# Patient Record
Sex: Male | Born: 1995 | Race: Black or African American | Hispanic: No | Marital: Married | Smoking: Never smoker
Health system: Southern US, Community
[De-identification: ages and names within clinical notes are randomized; demographics above are authoritative.]

---
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
## 2019-01-28 ENCOUNTER — Other Ambulatory Visit: Payer: Self-pay

## 2019-01-28 DIAGNOSIS — Z20822 Contact with and (suspected) exposure to covid-19: Secondary | ICD-10-CM

## 2019-01-29 LAB — NOVEL CORONAVIRUS, NAA: SARS-CoV-2, NAA: NOT DETECTED

## 2019-03-14 ENCOUNTER — Other Ambulatory Visit: Payer: Self-pay

## 2019-03-14 DIAGNOSIS — Z20822 Contact with and (suspected) exposure to covid-19: Secondary | ICD-10-CM

## 2019-03-17 LAB — NOVEL CORONAVIRUS, NAA: SARS-CoV-2, NAA: NOT DETECTED

## 2021-05-02 ENCOUNTER — Ambulatory Visit
Admission: EM | Admit: 2021-05-02 | Discharge: 2021-05-02 | Disposition: A | Discharge status: Home / Self Care | Payer: Self-pay

## 2021-05-02 ENCOUNTER — Other Ambulatory Visit: Payer: Self-pay

## 2021-05-02 DIAGNOSIS — A64 Unspecified sexually transmitted disease: Secondary | ICD-10-CM

## 2021-05-02 NOTE — ED Triage Notes (Signed)
Pt was tested for [facility] Department and was given a STD Test for everything but Herpes. Pt states that it was negative and he asks if a doctor can come in and test him for Herpes or if he can be evaluated for any sores for Herpes.   Pt was prescribed acyclovir in 2016.

## 2021-05-02 NOTE — ED Provider Notes (Signed)
[facility]    CSN: [id number] Arrival date & time: 05/02/21  1618      History   Chief Complaint Chief Complaint  Patient presents with   Exposure to STD    HPI Guy Vincent is a 26 y.o. male who wants to have a genital exam to make sure he does not have any STD lesions. He gets STD tests yearly, but has not been examined. Denies having any symptoms, and currently is in a monogamous relationship with a partner who had negative STD's.     History reviewed. No pertinent past medical history.  There are no problems to display for this patient.   History reviewed. No pertinent surgical history.     Home Medications    Prior to Admission medications   Medication Sig Start Date End Date Taking? Authorizing Provider  acyclovir (ZOVIRAX) 400 MG tablet Take 1 tablet (400 mg total) by mouth 4 (four) times daily. 10/31/14   Fayrene Helper, PA-C    Family History History reviewed. No pertinent family history.  Social History Social History   Tobacco Use   Smoking status: Never  Vaping Use   Vaping Use: Never used  Substance Use Topics   Alcohol use: No   Drug use: No     Allergies   Patient has no known allergies.   Review of Systems Review of Systems  All other systems reviewed and are negative.   Physical Exam Triage Vital Signs ED Triage Vitals  Enc Vitals Group     BP 05/02/21 1640 124/84     Pulse Rate 05/02/21 1640 80     Resp 05/02/21 1640 18     Temp 05/02/21 1640 98.7 F (37.1 C)     Temp Source 05/02/21 1640 Oral     SpO2 05/02/21 1640 99 %     Weight 05/02/21 1635 170 lb (77.1 kg)     Height 05/02/21 1635 5\' 9"  (1.753 m)     Head Circumference --      Peak Flow --      Pain Score 05/02/21 1635 0     Pain Loc --      Pain Edu? --      Excl. in GC? --    No data found.  Updated Vital Signs BP 124/84 (BP Location: Left Arm)    Pulse 80    Temp 98.7 F (37.1 C) (Oral)    Resp 18    Ht 5\' 9"  (1.753 m)    Wt 170 lb (77.1 kg)     SpO2 99%    BMI 25.10 kg/m   Visual Acuity Right Eye Distance:   Left Eye Distance:   Bilateral Distance:    Right Eye Near:   Left Eye Near:    Bilateral Near:     Physical Exam Vitals and nursing note reviewed.  Constitutional:      General: He is not in acute distress.    Appearance: He is not toxic-appearing.  Eyes:     General: No scleral icterus.    Conjunctiva/sclera: Conjunctivae normal.  Pulmonary:     Effort: Pulmonary effort is normal.  Genitourinary:    Penis: Normal.      Testes: Normal.  Musculoskeletal:        General: Normal range of motion.  Skin:    General: Skin is warm and dry.     Findings: No lesion or rash.  Neurological:     Mental Status: He is alert and  oriented to person, place, and time.     Gait: Gait normal.  Psychiatric:        Mood and Affect: Mood normal.        Behavior: Behavior normal.        Thought Content: Thought content normal.        Judgment: Judgment normal.     UC Treatments / Results  Labs (all labs ordered are listed, but only abnormal results are displayed) Labs Reviewed - No data to display  EKG   Radiology No results found.  Procedures Procedures (including critical care time)  Medications Ordered in UC Medications - No data to display  Initial Impression / Assessment and Plan / UC Course  I have reviewed the triage vital signs and the nursing notes. No disease found Pt reassured his genital exam is normal. He will FU with his PCP as usual.  Final Clinical Impressions(s) / UC Diagnoses   Final diagnoses:  STD (male)   Discharge Instructions   None    ED Prescriptions   None    PDMP not reviewed this encounter.   Garey Ham, PA-C 05/02/21 1703

## 2021-06-21 ENCOUNTER — Ambulatory Visit
Admission: EM | Admit: 2021-06-21 | Discharge: 2021-06-21 | Disposition: A | Discharge status: Home / Self Care | Payer: Self-pay

## 2021-06-21 ENCOUNTER — Other Ambulatory Visit: Payer: Self-pay

## 2021-06-21 DIAGNOSIS — L853 Xerosis cutis: Secondary | ICD-10-CM

## 2021-06-21 DIAGNOSIS — K117 Disturbances of salivary secretion: Secondary | ICD-10-CM

## 2021-06-21 DIAGNOSIS — T50905A Adverse effect of unspecified drugs, medicaments and biological substances, initial encounter: Secondary | ICD-10-CM

## 2021-06-21 NOTE — Medical Student Note (Incomplete)
? ?[facility] ?Provider Student Note ?For educational purposes for Medical, PA and NP students only and not part of the legal medical record. ? ? ?CSN: [id number] ?Arrival date & time: 06/21/21  1138 ? ? ?  ? ?History   ?Chief Complaint ?Chief Complaint  ?Patient presents with  ? Lip problem  ? ? ?HPI ?Guy Vincent is a 26 y.o. male. ? ?Guy Vincent is a 26 yo male presenting to the clinic for lip dryness. He said he started isotretinoin 1 month ago for acne and his lips have been dry since then. He has tried lip balm but he says they are not working. He denies lesions, sores, bleeding on his lips or in his mouth. ? ?The history is provided by the patient.  ? ?History reviewed. No pertinent past medical history. ? ?There are no problems to display for this patient. ? ? ?History reviewed. No pertinent surgical history. ? ? ? ? ?Home Medications   ? ?Prior to Admission medications   ?Medication Sig Start Date End Date Taking? Authorizing Provider  ?acyclovir (ZOVIRAX) 400 MG tablet Take 1 tablet (400 mg total) by mouth 4 (four) times daily. 10/31/14   Fayrene Helper, PA-C  ?ISOtretinoin (ACCUTANE) 30 MG capsule Take by mouth daily. 05/23/21   [provider]  ? ? ?Family History ?History reviewed. No pertinent family history. ? ?Social History ?Social History  ? ?Tobacco Use  ? Smoking status: Never  ?Vaping Use  ? Vaping Use: Never used  ?Substance Use Topics  ? Alcohol use: No  ? Drug use: Never  ? ? ? ?Allergies   ?Patient has no known allergies. ? ? ?Review of Systems ?Review of Systems  ?HENT:  Negative for mouth sores.   ?Skin:  Negative for color change, pallor, rash and wound.  ?     Dryness on lips  ? ? ?Physical Exam ?Updated Vital Signs ?BP 124/85 (BP Location: Right Arm)   Pulse 79   Temp 99.6 ?F (37.6 ?C) (Oral)   Resp 16   SpO2 97%  ? ?Physical Exam ?Constitutional:   ?   Appearance: Normal appearance.  ?HENT:  ?   Head: Normocephalic and atraumatic.  ?   Nose: Nose normal.  ?Eyes:  ?    Pupils: Pupils are equal, round, and reactive to light.  ?Cardiovascular:  ?   Rate and Rhythm: Normal rate and regular rhythm.  ?Pulmonary:  ?   Effort: Pulmonary effort is normal.  ?Musculoskeletal:  ?   Cervical back: Normal range of motion.  ?Skin: ?   General: Skin is warm and dry.  ?   Findings: No erythema, lesion or rash.  ?   Comments: Mild dryness of upper and lower lip  ?Neurological:  ?   Mental Status: He is alert.  ?Psychiatric:     ?   Mood and Affect: Mood normal.     ?   Behavior: Behavior normal.     ?   Thought Content: Thought content normal.     ?   Judgment: Judgment normal.  ? ? ? ?ED Treatments / Results  ?Labs ?(all labs ordered are listed, but only abnormal results are displayed) ?Labs Reviewed - No data to display ? ?EKG ? ?Radiology ?No results found. ? ?Procedures ?Procedures (including critical care time) ? ?Medications Ordered in ED ?Medications - No data to display ? ? ?Initial Impression / Assessment and Plan / ED Course  ?I have reviewed the triage vital signs and the  nursing notes. ? ?Pertinent labs & imaging results that were available during my care of the patient were reviewed by me and considered in my medical decision making (see chart for details). ? ?Patient was advised to take other emollients other than carmex for dry lips such as aquaphor or vaseline. Patient should avoid licking lips and should drink more water to improve hydration. ? ?  ? ?*** ? ?Final Clinical Impressions(s) / ED Diagnoses  ? ?Final diagnoses:  ?None  ? ? ?New Prescriptions ?New Prescriptions  ? No medications on file  ? ? ?

## 2021-06-21 NOTE — ED Provider Notes (Signed)
?[facility] ? ? ? ?CSN: [id number] ?Arrival date & time: 06/21/21  1138 ? ? ?  ? ?History   ?Chief Complaint ?Chief Complaint  ?Patient presents with  ? Lip problem  ? ? ?HPI ?Guy Vincent is a 26 y.o. male.  ? ?HPI ? ?26 year old male here for evaluation of integumentary complaints. ? ?Patient reports that he has been on isotretinoin for the last month and since starting that he has been noticing dry lips.  He has used Forensic psychologist but he is here requesting guidance on the best and will need to lose help with his symptoms.  He has not had any cracking or bleeding.  He is also having dry mouth which is caused him to increase his oral water consumption. ? ?History reviewed. No pertinent past medical history. ? ?There are no problems to display for this patient. ? ? ?History reviewed. No pertinent surgical history. ? ? ? ? ?Home Medications   ? ?Prior to Admission medications   ?Medication Sig Start Date End Date Taking? Authorizing Provider  ?acyclovir (ZOVIRAX) 400 MG tablet Take 1 tablet (400 mg total) by mouth 4 (four) times daily. 10/31/14   Fayrene Helper, PA-C  ?ISOtretinoin (ACCUTANE) 30 MG capsule Take by mouth daily. 05/23/21   [provider]  ? ? ?Family History ?History reviewed. No pertinent family history. ? ?Social History ?Social History  ? ?Tobacco Use  ? Smoking status: Never  ?Vaping Use  ? Vaping Use: Never used  ?Substance Use Topics  ? Alcohol use: No  ? Drug use: Never  ? ? ? ?Allergies   ?Patient has no known allergies. ? ? ?Review of Systems ?Review of Systems  ?HENT:    ?     Dry lips  ? ? ?Physical Exam ?Triage Vital Signs ?ED Triage Vitals  ?Enc Vitals Group  ?   BP 06/21/21 1156 124/85  ?   Pulse Rate 06/21/21 1156 79  ?   Resp 06/21/21 1156 16  ?   Temp 06/21/21 1156 99.6 ?F (37.6 ?C)  ?   Temp Source 06/21/21 1156 Oral  ?   SpO2 06/21/21 1156 97 %  ?   Weight --   ?   Height --   ?   Head Circumference --   ?   Peak Flow --   ?   Pain Score 06/21/21 1159 0  ?    Pain Loc --   ?   Pain Edu? --   ?   Excl. in GC? --   ? ?No data found. ? ?Updated Vital Signs ?BP 124/85 (BP Location: Right Arm)   Pulse 79   Temp 99.6 ?F (37.6 ?C) (Oral)   Resp 16   SpO2 97%  ? ?Visual Acuity ?Right Eye Distance:   ?Left Eye Distance:   ?Bilateral Distance:   ? ?Right Eye Near:   ?Left Eye Near:    ?Bilateral Near:    ? ?Physical Exam ?Vitals and nursing note reviewed.  ?Constitutional:   ?   Appearance: Normal appearance. He is not ill-appearing.  ?HENT:  ?   Head: Normocephalic and atraumatic.  ?   Mouth/Throat:  ?   Mouth: Mucous membranes are moist.  ?   Pharynx: Oropharynx is clear. No oropharyngeal exudate or posterior oropharyngeal erythema.  ?Skin: ?   General: Skin is warm and dry.  ?   Capillary Refill: Capillary refill takes less than 2 seconds.  ?   Findings: No erythema or  rash.  ?Neurological:  ?   General: No focal deficit present.  ?   Mental Status: He is alert and oriented to person, place, and time.  ?Psychiatric:     ?   Mood and Affect: Mood normal.     ?   Behavior: Behavior normal.     ?   Thought Content: Thought content normal.     ?   Judgment: Judgment normal.  ? ? ? ?UC Treatments / Results  ?Labs ?(all labs ordered are listed, but only abnormal results are displayed) ?Labs Reviewed - No data to display ? ?EKG ? ? ?Radiology ?No results found. ? ?Procedures ?Procedures (including critical care time) ? ?Medications Ordered in UC ?Medications - No data to display ? ?Initial Impression / Assessment and Plan / UC Course  ?I have reviewed the triage vital signs and the nursing notes. ? ?Pertinent labs & imaging results that were available during my care of the patient were reviewed by me and considered in my medical decision making (see chart for details). ? ?Patient is a nontoxic-appearing 26 year old male here complaining of dry lips after being started on Accutane for acne.  He has been on the medication for a month and noticed that that his dry lips started soon  afterwards.  He is also had some dry mouth.  He is able to manage the dry mouth with increasing his oral water intake.  He has been using Carmex on his lips and recently switched to Blistex.  He is here seeking guidance on the best emollient to use to help with his chapped lips.  On exam his lips are pink and moist without cracking, flaking, or fissures.  Oral mucous membranes are pink and moist.  Advised the patient that he should probably stop using Carmex as it has fiberglass particles in it which help exfoliate the lips but Blistex would be fine to use during the day or he can use Aquaphor.  I also suggested Vaseline or bag balm for use at bedtime.  For his dry mouth he should continue to drink water and he can also suck on hard candies to help produce saliva to help with his dry mouth symptoms. ? ? ?Final Clinical Impressions(s) / UC Diagnoses  ? ?Final diagnoses:  ?Xerosis of skin  ?Drug-induced xerostomia  ? ? ? ?Discharge Instructions   ? ?  ?Use over-the-counter emollient such as Aquaphor or Blistex during the day to help with your chapped lips.  You can apply Vaseline or bag balm to your lips at nighttime to help improve hydration and moisturization. ? ?Avoid licking your lips. ? ?Avoid wearing a mask as much as possible as this can also cause drying. ? ?Follow-up with your dermatologist. ? ? ? ? ?ED Prescriptions   ?None ?  ? ?PDMP not reviewed this encounter. ?  ?Becky Augusta, NP ?06/21/21 1309 ? ?

## 2021-06-21 NOTE — ED Triage Notes (Signed)
Pt reports his lips are dry x 1 month after he started taking isotretinoin for acne.  ?

## 2021-06-21 NOTE — Discharge Instructions (Addendum)
Use over-the-counter emollient such as Aquaphor or Blistex during the day to help with your chapped lips.  You can apply Vaseline or bag balm to your lips at nighttime to help improve hydration and moisturization. ? ?Avoid licking your lips. ? ?Avoid wearing a mask as much as possible as this can also cause drying. ? ?Follow-up with your dermatologist. ?

## 2023-07-27 ENCOUNTER — Ambulatory Visit (INDEPENDENT_AMBULATORY_CARE_PROVIDER_SITE_OTHER)

## 2023-07-27 ENCOUNTER — Ambulatory Visit
Admission: EM | Admit: 2023-07-27 | Discharge: 2023-07-27 | Disposition: A | Discharge status: Home / Self Care | Attending: Physician Assistant | Admitting: Physician Assistant

## 2023-07-27 DIAGNOSIS — M79644 Pain in right finger(s): Secondary | ICD-10-CM

## 2023-07-27 DIAGNOSIS — S6010XA Contusion of unspecified finger with damage to nail, initial encounter: Secondary | ICD-10-CM | POA: Diagnosis not present

## 2023-07-27 DIAGNOSIS — S62664A Nondisplaced fracture of distal phalanx of right ring finger, initial encounter for closed fracture: Secondary | ICD-10-CM

## 2023-07-27 NOTE — ED Triage Notes (Signed)
 Pt states he got his right ring finger stuck in a door last night. Now having bruising and pain in his finger.

## 2023-07-27 NOTE — Discharge Instructions (Addendum)
-  Xray shows a fracture of your finger -Use the splint to protect the finger.  -This can take a few weeks to heal -Ice and elevate finger -Avoid use until it is healed -Follow up with ortho in the next week  You have a condition requiring you to follow up with Orthopedics so please call one of the following office for appointment:   [facility] Address: [address], [place], [place] [address] Phone: [phone]  [facility] [address], [place], [place] [address] Phone: [phone]  [facility] [address], [place], [place] [address] Phone: [phone]

## 2023-07-27 NOTE — ED Provider Notes (Signed)
 [facility]    CSN: [id number] Arrival date & time: 07/27/23  4098      History   Chief Complaint Chief Complaint  Patient presents with   Finger Injury    HPI Guy Vincent is a 28 y.o. right hand dominant male presenting for pain, swelling and contusion of the distal right ring finger since last night. He was it was smashed in a swinging door. Denies numbness, weakness. Not taking OTC meds. No other concerns.  HPI  History reviewed. No pertinent past medical history.  There are no active problems to display for this patient.   History reviewed. No pertinent surgical history.     Home Medications    Prior to Admission medications   Medication Sig Start Date End Date Taking? Authorizing Provider  acyclovir  (ZOVIRAX ) 400 MG tablet Take 1 tablet (400 mg total) by mouth 4 (four) times daily. 10/31/14   Debbra Fairy, PA-C  ISOtretinoin (ACCUTANE) 30 MG capsule Take by mouth daily. 05/23/21   [provider]    Family History History reviewed. No pertinent family history.  Social History Social History   Tobacco Use   Smoking status: Never   Smokeless tobacco: Never  Vaping Use   Vaping status: Never Used  Substance Use Topics   Alcohol use: No   Drug use: Never     Allergies   Patient has no known allergies.   Review of Systems Review of Systems  Musculoskeletal:  Positive for arthralgias and joint swelling.  Skin:  Positive for color change. Negative for wound.  Neurological:  Negative for weakness and numbness.     Physical Exam Triage Vital Signs ED Triage Vitals [07/27/23 1014]  Encounter Vitals Group     BP 122/82     Systolic BP Percentile      Diastolic BP Percentile      Pulse Rate 72     Resp 16     Temp 98.7 F (37.1 C)     Temp Source Oral     SpO2 98 %     Weight      Height      Head Circumference      Peak Flow      Pain Score 8     Pain Loc      Pain Education      Exclude from Growth Chart    No  data found.  Updated Vital Signs BP 122/82 (BP Location: Left Arm)   Pulse 72   Temp 98.7 F (37.1 C) (Oral)   Resp 16   SpO2 98%       Physical Exam Vitals and nursing note reviewed.  Constitutional:      General: He is not in acute distress.    Appearance: Normal appearance. He is well-developed. He is not ill-appearing.  HENT:     Head: Normocephalic and atraumatic.  Eyes:     General: No scleral icterus.    Conjunctiva/sclera: Conjunctivae normal.  Cardiovascular:     Rate and Rhythm: Normal rate.     Pulses: Normal pulses.     Heart sounds: No murmur heard. Pulmonary:     Effort: Pulmonary effort is normal. No respiratory distress.  Musculoskeletal:     Cervical back: Neck supple.     Comments: RIGHT RING FINGER: There is contusion and swelling of distal finger with mild  subungual hematoma. Reduced ROM DIP. Good pulses and strength  Skin:    General: Skin is warm and dry.  Capillary Refill: Capillary refill takes less than 2 seconds.  Neurological:     General: No focal deficit present.     Mental Status: He is alert. Mental status is at baseline.     Motor: No weakness.     Gait: Gait normal.  Psychiatric:        Mood and Affect: Mood normal.        Behavior: Behavior normal.      UC Treatments / Results  Labs (all labs ordered are listed, but only abnormal results are displayed) Labs Reviewed - No data to display  EKG   Radiology DG Finger Ring Right Result Date: 07/27/2023 CLINICAL DATA:  Right ring finger pain, swelling and contusion following a crush injury. EXAM: RIGHT RING FINGER 2+V COMPARISON:  None Available. FINDINGS: Comminuted fracture of the distal aspect of the 4th distal phalanx, including the distal tuft. Minimal palmar angulation of the distal fragment. Minimal ulnar displacement of the main distal fragment. IMPRESSION: Comminuted fracture of the 4th distal phalanx. Electronically Signed   By: Catherin Closs M.D.   On: 07/27/2023 11:19     Procedures Procedures (including critical care time)  Medications Ordered in UC Medications - No data to display  Initial Impression / Assessment and Plan / UC Course  I have reviewed the triage vital signs and the nursing notes.  Pertinent labs & imaging results that were available during my care of the patient were reviewed by me and considered in my medical decision making (see chart for details).   28 year old male presents for pain, swelling and contusion of the right distal ring finger since last night after getting it smashed by a door.  Advised imaging.  Patient will contact his insurance carrier to see how much this would cost him.  He states he may want to just see his primary care provider and have them order the imaging for him.  After contacting his insurance company, patient agrees to go forward with the imaging.  X-ray of finger obtained.  Wet read shows fracture of distal phalanx of the right fourth finger.  Reviewed this with him and showed him the images.  Discussed fracture care guidelines and RICE guidelines.  Patient was placed in finger splint and given information to follow-up with orthopedics.  Work note was provided.  Final Clinical Impressions(s) / UC Diagnoses   Final diagnoses:  Finger pain, right  Closed nondisplaced fracture of distal phalanx of right ring finger, initial encounter  Subungual hematoma of digit of hand, initial encounter     Discharge Instructions      -Xray shows a fracture of your finger -Use the splint to protect the finger.  -This can take a few weeks to heal -Ice and elevate finger -Avoid use until it is healed -Follow up with ortho in the next week  You have a condition requiring you to follow up with Orthopedics so please call one of the following office for appointment:   [facility] Address: [address] [place], [place] [address] Phone: [phone]  [facility] [address], [place], [place] [address] Phone:  [phone]  [facility] [address] [place], [place] [address] Phone: [phone]      ED Prescriptions   None    PDMP not reviewed this encounter.   Floydene Hy, PA-C 07/27/23 1144
